# Patient Record
Sex: Male | Born: 1950 | Race: White | Hispanic: No | Marital: Single | Smoking: Former smoker
Health system: Southern US, Community
[De-identification: ages and names within clinical notes are randomized; demographics above are authoritative.]

## PROBLEM LIST (undated history)

## (undated) DIAGNOSIS — R030 Elevated blood-pressure reading, without diagnosis of hypertension: Secondary | ICD-10-CM

## (undated) DIAGNOSIS — J449 Chronic obstructive pulmonary disease, unspecified: Secondary | ICD-10-CM

## (undated) DIAGNOSIS — R351 Nocturia: Secondary | ICD-10-CM

## (undated) DIAGNOSIS — J439 Emphysema, unspecified: Secondary | ICD-10-CM

## (undated) DIAGNOSIS — R7989 Other specified abnormal findings of blood chemistry: Secondary | ICD-10-CM

## (undated) DIAGNOSIS — R319 Hematuria, unspecified: Secondary | ICD-10-CM

## (undated) DIAGNOSIS — N529 Male erectile dysfunction, unspecified: Secondary | ICD-10-CM

## (undated) DIAGNOSIS — E785 Hyperlipidemia, unspecified: Secondary | ICD-10-CM

## (undated) DIAGNOSIS — N401 Enlarged prostate with lower urinary tract symptoms: Secondary | ICD-10-CM

## (undated) HISTORY — DX: Benign prostatic hyperplasia with lower urinary tract symptoms: N40.1

## (undated) HISTORY — DX: Chronic obstructive pulmonary disease, unspecified: J44.9

## (undated) HISTORY — DX: Emphysema, unspecified: J43.9

## (undated) HISTORY — DX: Hyperlipidemia, unspecified: E78.5

## (undated) HISTORY — DX: Male erectile dysfunction, unspecified: N52.9

## (undated) HISTORY — DX: Other specified abnormal findings of blood chemistry: R79.89

## (undated) HISTORY — DX: Hematuria, unspecified: R31.9

## (undated) HISTORY — PX: OTHER SURGICAL HISTORY: SHX169

## (undated) HISTORY — DX: Nocturia: R35.1

## (undated) HISTORY — DX: Elevated blood-pressure reading, without diagnosis of hypertension: R03.0

---

## 2011-07-22 ENCOUNTER — Ambulatory Visit: Payer: Self-pay | Admitting: Family Medicine

## 2011-09-14 ENCOUNTER — Emergency Department: Payer: Self-pay

## 2012-07-07 ENCOUNTER — Emergency Department: Payer: Self-pay | Admitting: Emergency Medicine

## 2012-08-29 ENCOUNTER — Ambulatory Visit: Payer: Self-pay | Admitting: Internal Medicine

## 2012-09-14 ENCOUNTER — Ambulatory Visit: Payer: Self-pay | Admitting: Specialist

## 2014-06-06 IMAGING — CR DG RIBS 2V*R*
1 series · 4 of 4 positions shown · non-contrast
Comparison: none

REASON FOR EXAM: right rib pain
COMMENTS:

PROCEDURE:     MDR - MDR RIBS RIGHT UNLILATERAL  - August 29, 2012  [DATE]
RESULT:     The bones are osteopenic. There is prominent costochondral
calcification. No definite fracture is identified the marker over the area
the patient's pain is along the inferior margin of the right ribs.

[Series 1: ap · 0.17mm/px · 4 of 4 slices shown]
[im 1/4]
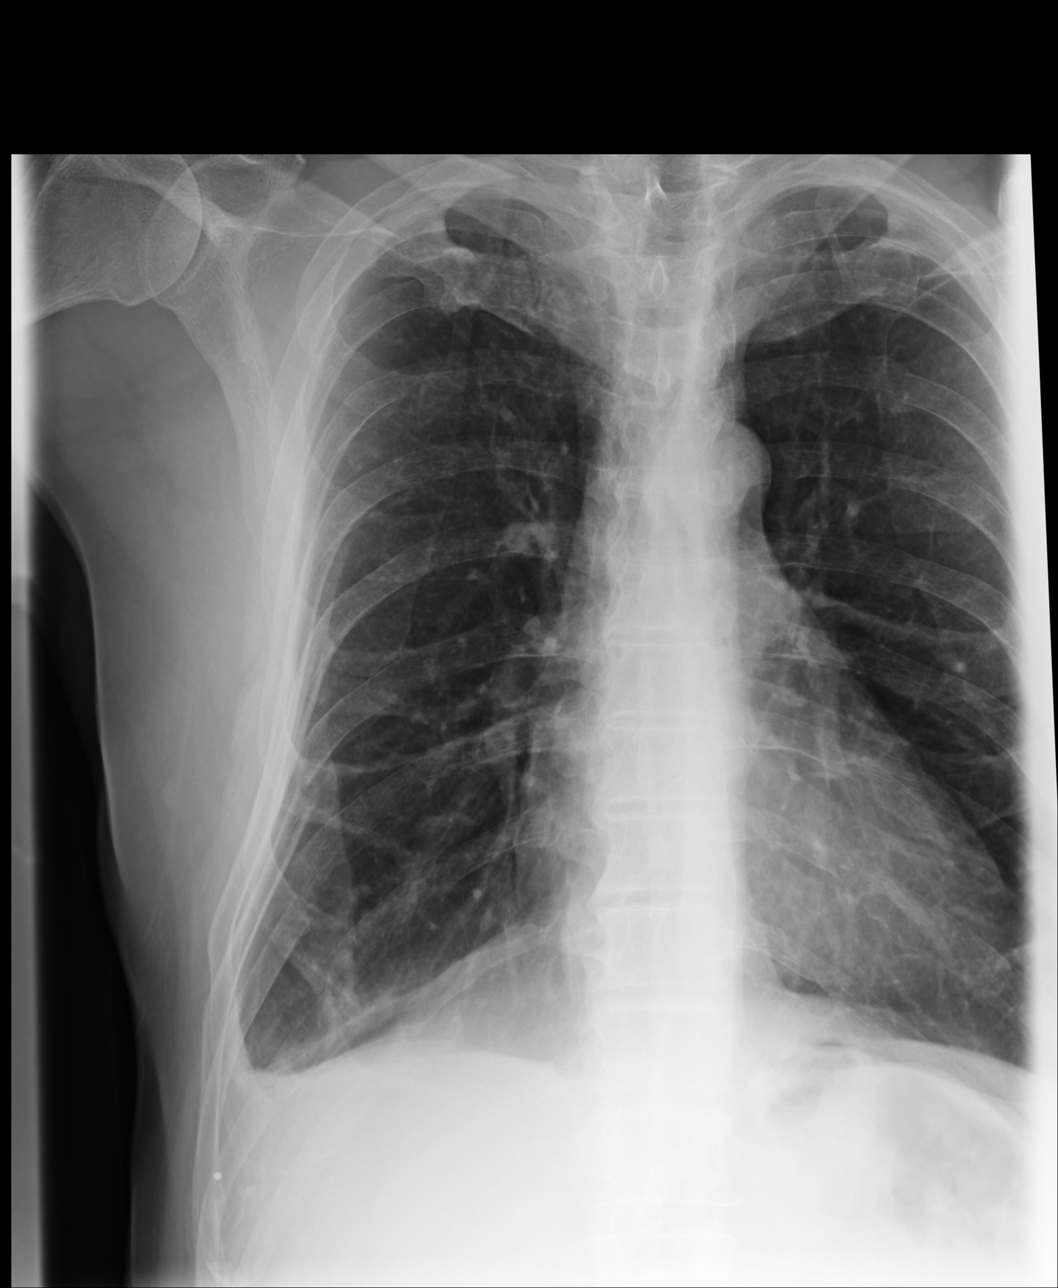
[im 2/4]
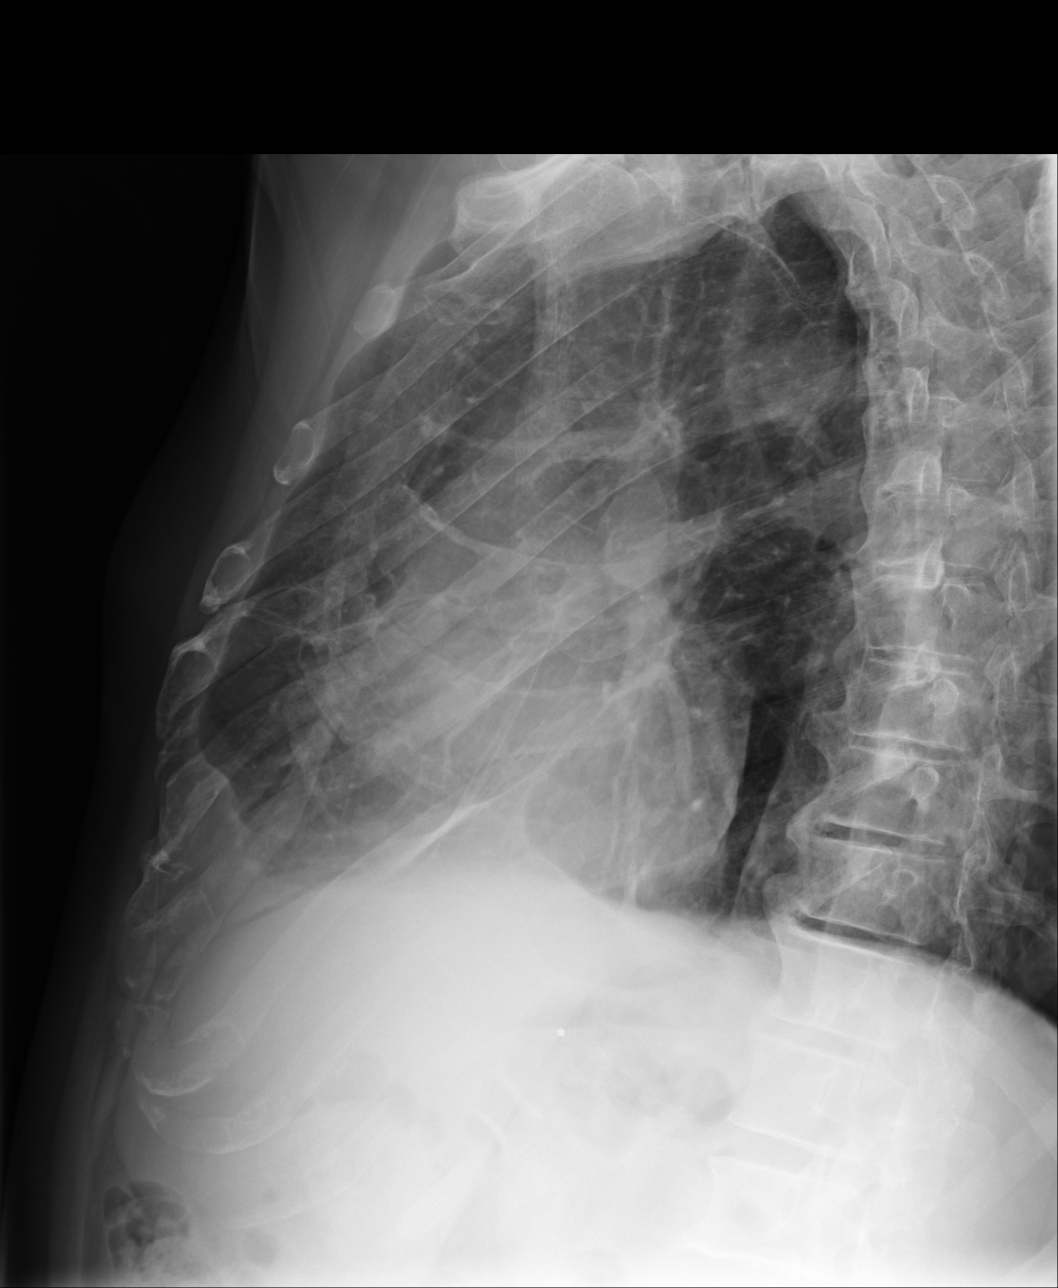
[im 3/4]
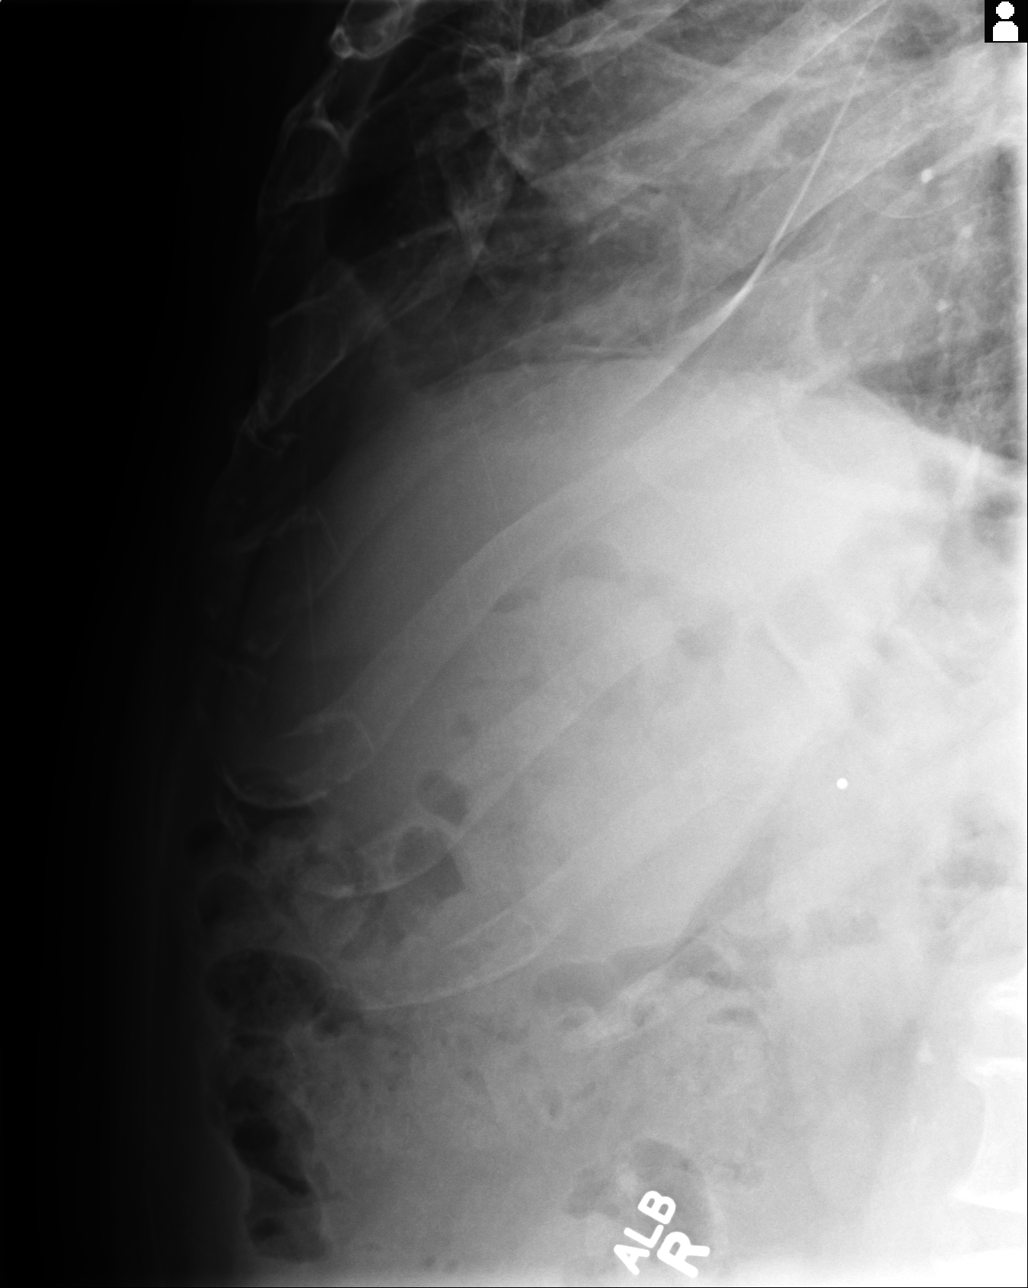
[im 4/4]
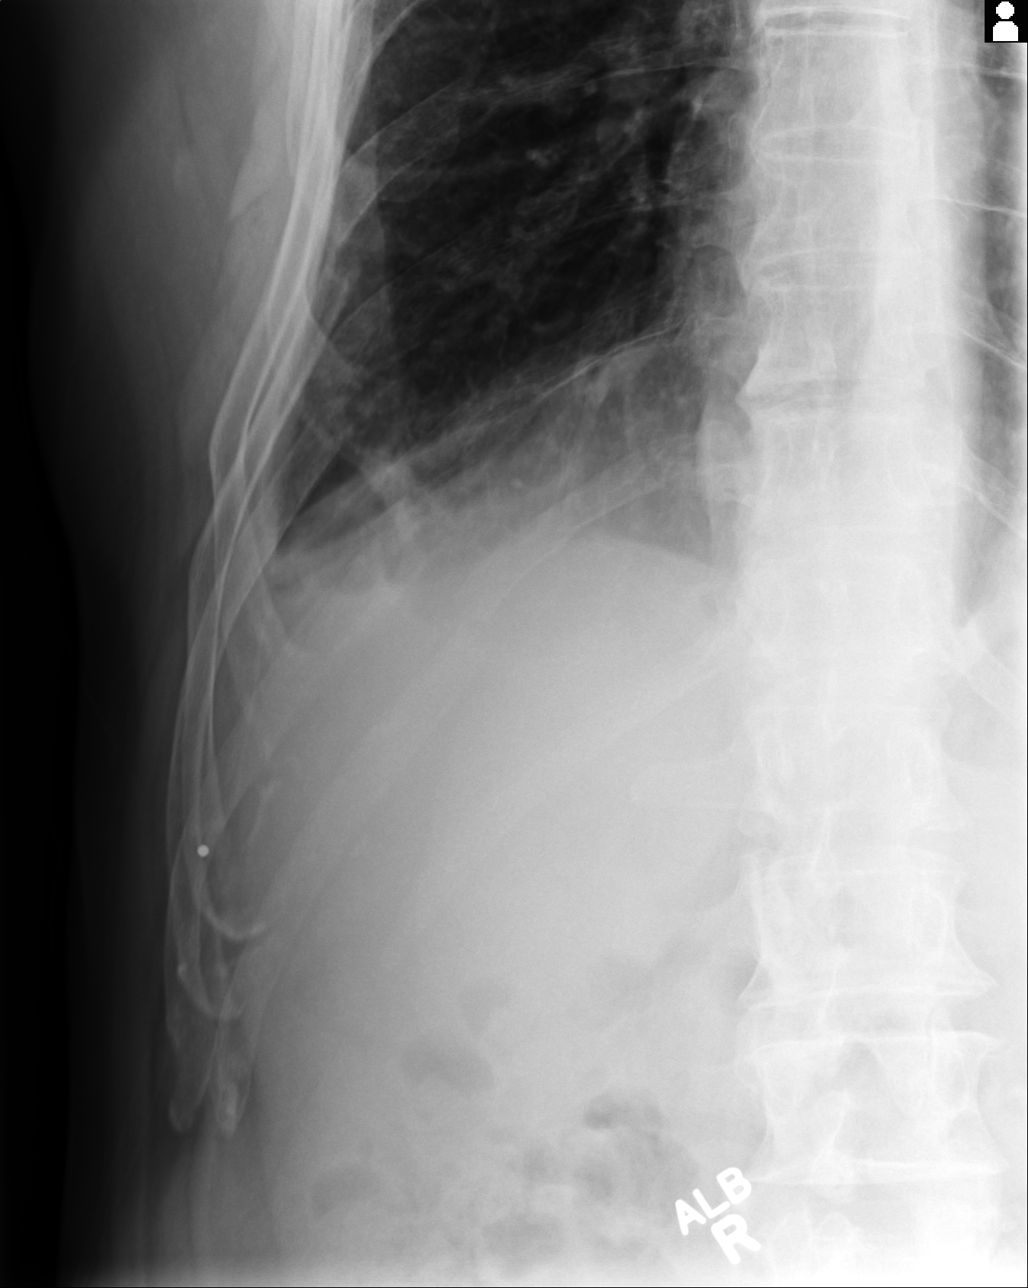

[4 of 4 positions shown; findings below may reference images not displayed]

IMPRESSION: No definite fracture. The bones are very osteopenic which
mixture underlying fracture.

[REDACTED]

## 2014-06-06 IMAGING — CR DG CHEST 2V
1 series · 3 of 3 positions shown · non-contrast
Comparison: none

REASON FOR EXAM: cough with recent rib injury from 07-07-12
COMMENTS:

[Series 1: pa · 0.17mm/px · 3 of 3 slices shown]
[im 1/3]
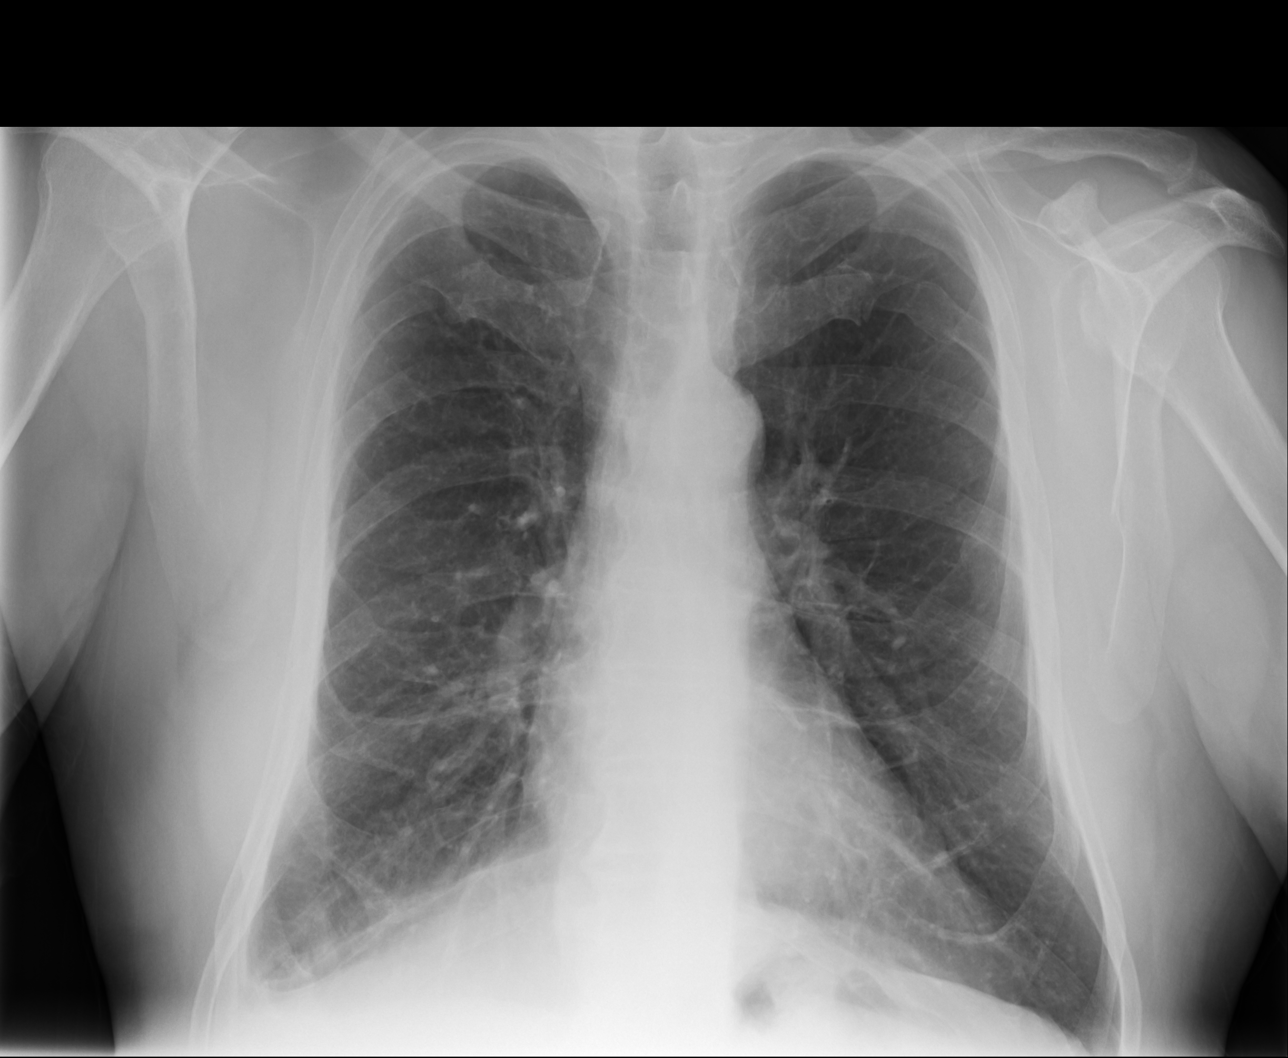
[im 2/3]
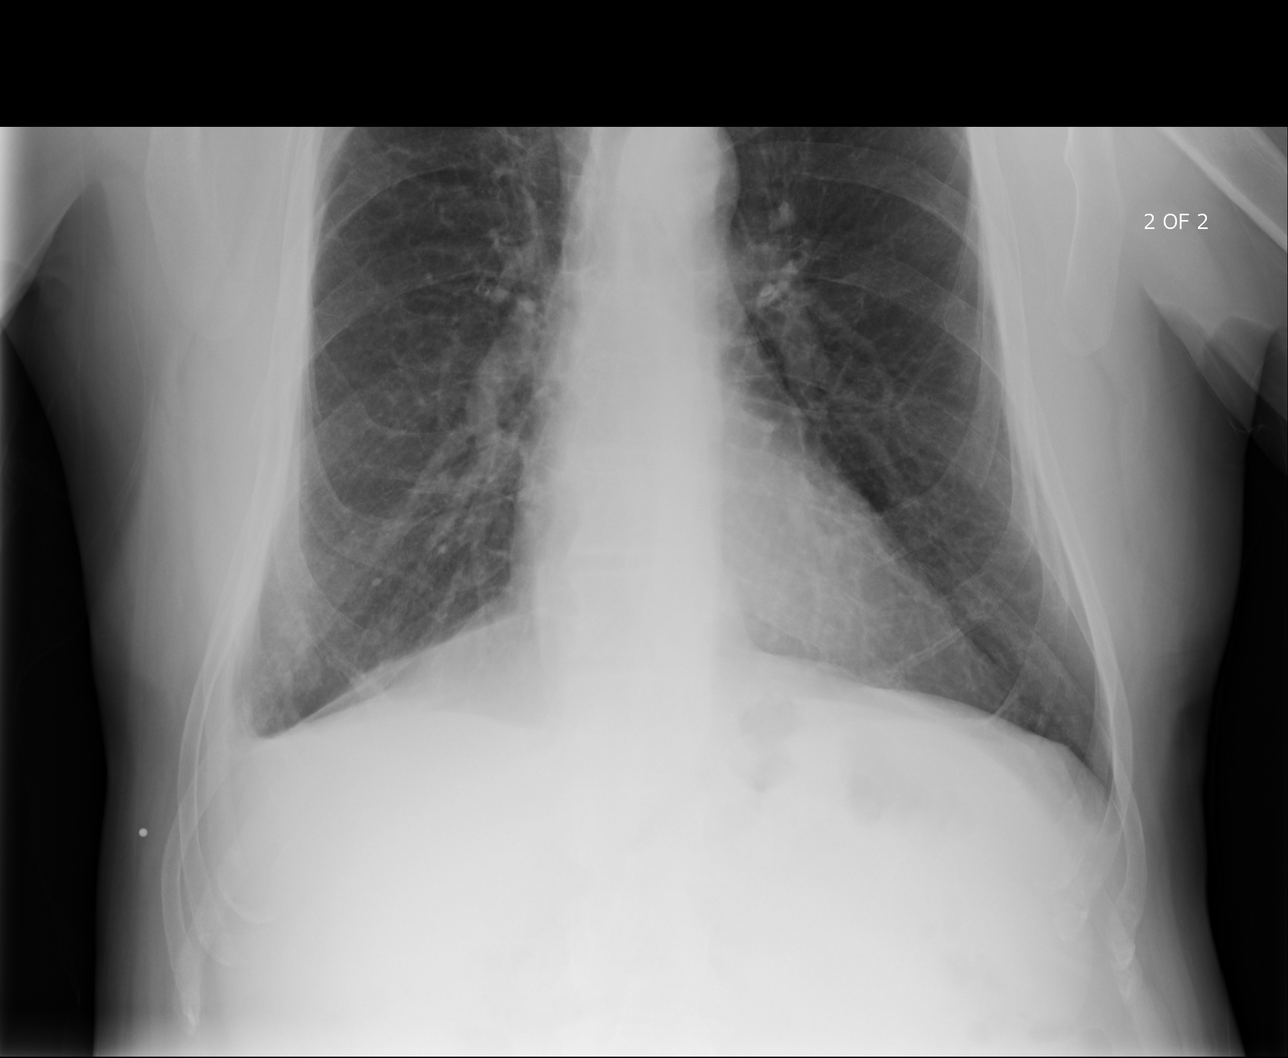
[im 3/3]
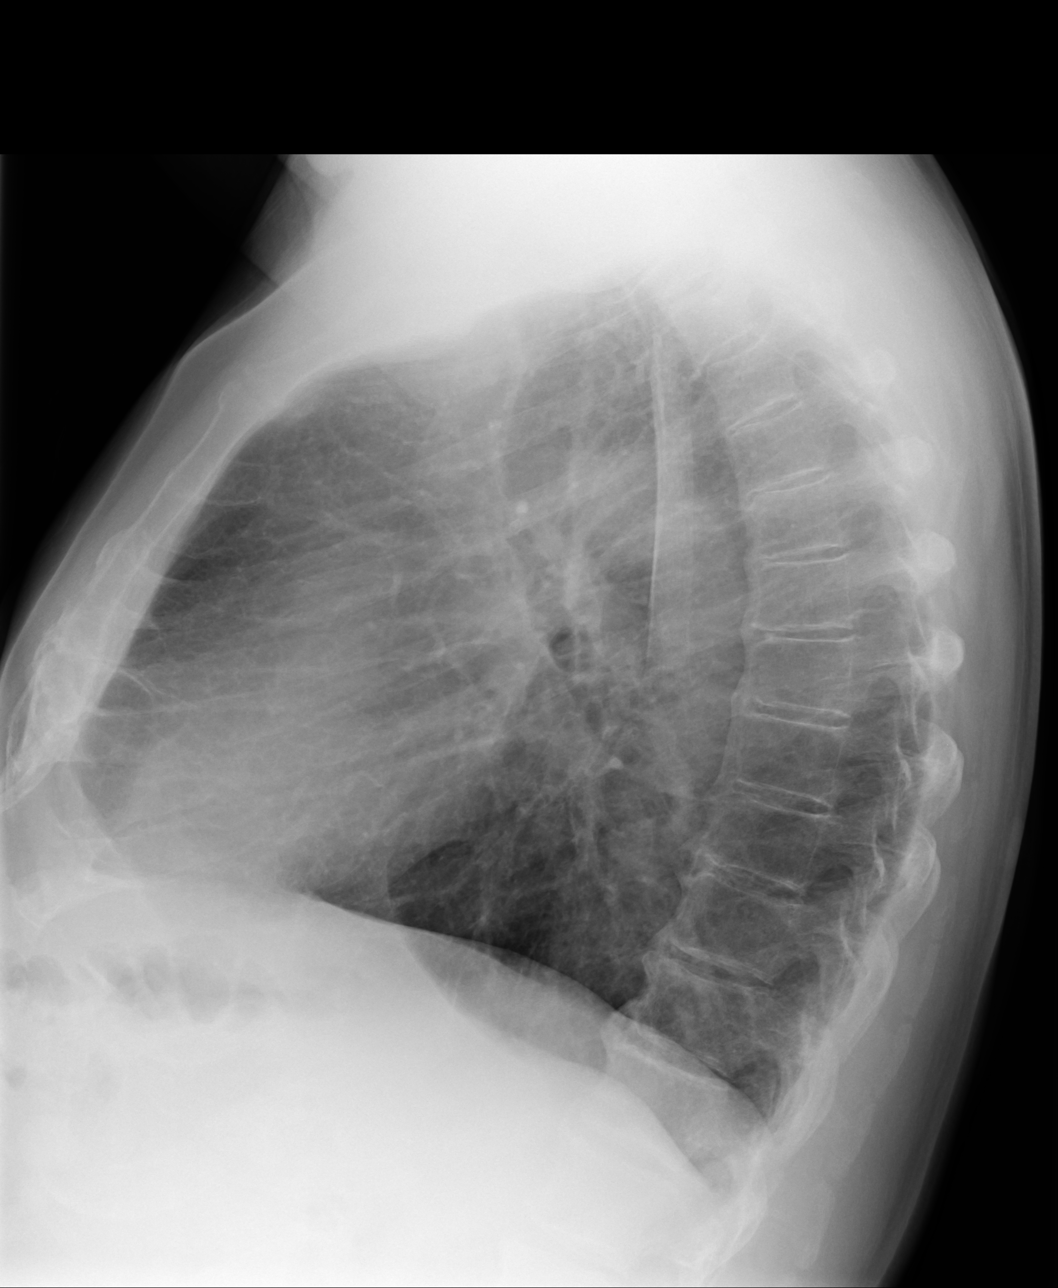

[3 of 3 positions shown; findings below may reference images not displayed]

PROCEDURE:     MDR - MDR CHEST PA(OR AP) AND LATERAL  - August 29, 2012  [DATE]

RESULT:     Comparison is made to the study 07 July, 2012.

The lungs are mildly hyperinflated but appear to be clear of infiltrate,
edema, effusion, mass and pneumothorax. Cardiac silhouette is normal in
size. Prominent costochondral calcification is present. There is minimal
blunting of the right costophrenic angle but not in the posterior gutter.
This could be secondary to pleural thickening or trace pleural fluid. The
possibility of an occult fracture in this region is not excluded. Correlate
clinically.
IMPRESSION: No acute cardiopulmonary disease evident. Note is made of
some minimal blunting in the right costophrenic angle region on the frontal
projections. No significant effusion is seen on the lateral view. The
possibility of pleural thickening or trace pleural effusion secondary to
underlying occult rib fractures not excluded. No pneumothorax evident.

[REDACTED]

## 2014-07-24 DIAGNOSIS — J439 Emphysema, unspecified: Secondary | ICD-10-CM | POA: Diagnosis not present

## 2014-07-24 DIAGNOSIS — R351 Nocturia: Secondary | ICD-10-CM | POA: Diagnosis not present

## 2014-07-24 DIAGNOSIS — R7989 Other specified abnormal findings of blood chemistry: Secondary | ICD-10-CM | POA: Diagnosis not present

## 2014-07-24 DIAGNOSIS — Z23 Encounter for immunization: Secondary | ICD-10-CM | POA: Diagnosis not present

## 2014-07-24 DIAGNOSIS — Z0000 Encounter for general adult medical examination without abnormal findings: Secondary | ICD-10-CM | POA: Diagnosis not present

## 2014-07-24 DIAGNOSIS — E782 Mixed hyperlipidemia: Secondary | ICD-10-CM | POA: Diagnosis not present

## 2014-07-24 DIAGNOSIS — N401 Enlarged prostate with lower urinary tract symptoms: Secondary | ICD-10-CM | POA: Diagnosis not present

## 2014-09-15 DIAGNOSIS — J439 Emphysema, unspecified: Secondary | ICD-10-CM | POA: Diagnosis not present

## 2015-01-26 ENCOUNTER — Ambulatory Visit: Payer: Self-pay | Admitting: Internal Medicine

## 2015-01-27 ENCOUNTER — Encounter: Payer: Self-pay | Admitting: Internal Medicine

## 2015-01-27 ENCOUNTER — Ambulatory Visit (INDEPENDENT_AMBULATORY_CARE_PROVIDER_SITE_OTHER): Payer: Medicare Other | Admitting: Internal Medicine

## 2015-01-27 VITALS — BP 142/82 | HR 76 | Ht 71.0 in | Wt 184.4 lb

## 2015-01-27 DIAGNOSIS — R351 Nocturia: Secondary | ICD-10-CM

## 2015-01-27 DIAGNOSIS — R7989 Other specified abnormal findings of blood chemistry: Secondary | ICD-10-CM | POA: Insufficient documentation

## 2015-01-27 DIAGNOSIS — J449 Chronic obstructive pulmonary disease, unspecified: Secondary | ICD-10-CM

## 2015-01-27 DIAGNOSIS — N529 Male erectile dysfunction, unspecified: Secondary | ICD-10-CM | POA: Insufficient documentation

## 2015-01-27 DIAGNOSIS — N401 Enlarged prostate with lower urinary tract symptoms: Secondary | ICD-10-CM

## 2015-01-27 DIAGNOSIS — E785 Hyperlipidemia, unspecified: Secondary | ICD-10-CM

## 2015-01-27 DIAGNOSIS — J40 Bronchitis, not specified as acute or chronic: Secondary | ICD-10-CM

## 2015-01-27 HISTORY — DX: Nocturia: R35.1

## 2015-01-27 HISTORY — DX: Male erectile dysfunction, unspecified: N52.9

## 2015-01-27 HISTORY — DX: Benign prostatic hyperplasia with lower urinary tract symptoms: N40.1

## 2015-01-27 HISTORY — DX: Hyperlipidemia, unspecified: E78.5

## 2015-01-27 HISTORY — DX: Chronic obstructive pulmonary disease, unspecified: J44.9

## 2015-01-27 HISTORY — DX: Other specified abnormal findings of blood chemistry: R79.89

## 2015-01-27 MED ORDER — METHYLPREDNISOLONE 4 MG PO TBPK: ORAL_TABLET | ORAL | Status: DC

## 2015-01-27 MED ORDER — AMOXICILLIN-POT CLAVULANATE 875-125 MG PO TABS: 1.0000 | ORAL_TABLET | Freq: Two times a day (BID) | ORAL | Status: DC

## 2015-01-27 MED ORDER — GUAIFENESIN-CODEINE 100-10 MG/5ML PO SYRP: 5.0000 mL | ORAL_SOLUTION | Freq: Three times a day (TID) | ORAL | Status: DC | PRN

## 2015-01-27 NOTE — Patient Instructions (Signed)

## 2015-01-27 NOTE — Progress Notes (Signed)
Date:  01/27/2015   Name:  Benjamin Mclaughlin   DOB:  May 25, 1950   MRN:  [id number]   Chief Complaint: Cough and Sore Throat  patient presents with 1 week of cough sore throat and hoarseness. He's been bringing up some pale brown thick phlegm. He denies fever or chills. He denies wheezing. He says at baseline he can walk a mile on the treadmill without having to rest. He has a diagnosis of COPD has not smoked in 16 years. He's tried a number of inhalers in the past but states that they never seem to help. He is currently on no medications.  Review of Systems:  Review of Systems  Constitutional: Positive for fatigue. Negative for fever, chills and diaphoresis.  HENT: Positive for sore throat and voice change. Negative for ear discharge, hearing loss, sinus pressure, sneezing and trouble swallowing.   Respiratory: Positive for cough. Negative for chest tightness, shortness of breath and wheezing.   Cardiovascular: Negative for chest pain and palpitations.  Gastrointestinal: Negative for nausea, abdominal pain and diarrhea.  Hematological: Negative for adenopathy.    Patient Active Problem List   Diagnosis Date Noted  . COPD, moderate 01/27/2015  . Abnormal TSH 01/27/2015  . Benign prostatic hypertrophy (BPH) with nocturia 01/27/2015  . Hyperlipidemia 01/27/2015  . Erectile dysfunction 01/27/2015    Prior to Admission medications   Not on File    No Known Allergies  No past surgical history on file.  Social History  Substance Use Topics  . Smoking status: Former Research scientist (life sciences)  . Smokeless tobacco: None  . Alcohol Use: 0.0 oz/week    0 Standard drinks or equivalent per week     Medication list has been reviewed and updated.  Physical Examination:  Physical Exam  Constitutional: He appears well-developed and well-nourished.  HENT:  Nose: Right sinus exhibits no maxillary sinus tenderness. Left sinus exhibits no maxillary sinus tenderness.  Mouth/Throat: Uvula is midline and  oropharynx is clear and moist.  Hoarse voice  Cardiovascular: Normal rate, regular rhythm and normal heart sounds.   Pulmonary/Chest: Effort normal. He has decreased breath sounds in the right upper field and the left upper field. He has no wheezes. He has no rhonchi.  Lymphadenopathy:    He has no cervical adenopathy.  Neurological: He is alert.    BP 142/82 mmHg  Pulse 76  Ht '5\' 11"'$  (1.803 m)  Wt 184 lb 6.4 oz (83.643 kg)  BMI 25.73 kg/m2  SpO2 97%  Assessment and Plan: 1. Bronchitis - amoxicillin-clavulanate (AUGMENTIN) 875-125 MG per tablet; Take 1 tablet by mouth 2 (two) times daily.  Dispense: 20 tablet; Refill: 0 - guaiFENesin-codeine (ROBITUSSIN AC) 100-10 MG/5ML syrup; Take 5 mLs by mouth 3 (three) times daily as needed for cough.  Dispense: 150 mL; Refill: 0  2. COPD, moderate - methylPREDNISolone (MEDROL DOSEPAK) 4 MG TBPK tablet; Take as directed -  Dispense: 21 tablet; Refill: 0   Halina Maidens, MD [facility]  01/27/2015

## 2015-01-30 ENCOUNTER — Telehealth: Payer: Self-pay

## 2015-01-30 DIAGNOSIS — J40 Bronchitis, not specified as acute or chronic: Secondary | ICD-10-CM

## 2015-01-30 MED ORDER — CEFDINIR 300 MG PO CAPS: 300.0000 mg | ORAL_CAPSULE | Freq: Two times a day (BID) | ORAL | Status: DC

## 2015-01-30 NOTE — Telephone Encounter (Signed)
Patient have watery stools five times or more a day with Augmentin, can you send something different.dr

## 2015-01-30 NOTE — Telephone Encounter (Signed)
Augmentin caused diarrhea.  Patient request another antibiotics.

## 2015-02-26 ENCOUNTER — Other Ambulatory Visit: Payer: Self-pay

## 2015-02-26 ENCOUNTER — Other Ambulatory Visit: Payer: Self-pay | Admitting: Internal Medicine

## 2015-02-26 DIAGNOSIS — J40 Bronchitis, not specified as acute or chronic: Secondary | ICD-10-CM

## 2015-12-31 ENCOUNTER — Ambulatory Visit (INDEPENDENT_AMBULATORY_CARE_PROVIDER_SITE_OTHER): Payer: Medicare Other | Admitting: Urology

## 2015-12-31 ENCOUNTER — Encounter: Payer: Self-pay | Admitting: Urology

## 2015-12-31 VITALS — BP 172/89 | HR 82 | Ht 71.0 in | Wt 179.0 lb

## 2015-12-31 DIAGNOSIS — N400 Enlarged prostate without lower urinary tract symptoms: Secondary | ICD-10-CM

## 2015-12-31 DIAGNOSIS — Z125 Encounter for screening for malignant neoplasm of prostate: Secondary | ICD-10-CM | POA: Insufficient documentation

## 2015-12-31 DIAGNOSIS — Z23 Encounter for immunization: Secondary | ICD-10-CM | POA: Insufficient documentation

## 2015-12-31 DIAGNOSIS — R972 Elevated prostate specific antigen [PSA]: Secondary | ICD-10-CM

## 2015-12-31 DIAGNOSIS — IMO0001 Reserved for inherently not codable concepts without codable children: Secondary | ICD-10-CM

## 2015-12-31 DIAGNOSIS — R030 Elevated blood-pressure reading, without diagnosis of hypertension: Secondary | ICD-10-CM

## 2015-12-31 DIAGNOSIS — R319 Hematuria, unspecified: Secondary | ICD-10-CM

## 2015-12-31 DIAGNOSIS — J439 Emphysema, unspecified: Secondary | ICD-10-CM

## 2015-12-31 HISTORY — DX: Hematuria, unspecified: R31.9

## 2015-12-31 HISTORY — DX: Emphysema, unspecified: J43.9

## 2015-12-31 HISTORY — DX: Reserved for inherently not codable concepts without codable children: IMO0001

## 2015-12-31 MED ORDER — TAMSULOSIN HCL 0.4 MG PO CAPS: 0.4000 mg | ORAL_CAPSULE | Freq: Every day | ORAL | 11 refills | Status: AC

## 2015-12-31 NOTE — Progress Notes (Signed)
12/31/2015 2:54 PM   Benjamin Mclaughlin [birth year] [id number]  Referring provider: [name], MD [address] [name], [place] [address]  Chief Complaint  Patient presents with  . Elevated PSA    New Patient    HPI: The patient is a 65 year old gentleman who presents for evaluation of an elevated PSA. His PSA is currently 5.1. It was 1.1 in 2016 and 1.8 2014.  The patient's biggest complaint this time is nocturia 3-4. He also has daytime frequency every 2 hours. He notes a weak stream. This has been getting worse over the last few years. He's never tried medication for his urinary issues before. He does also note some urgency. He denies intermittency.   PMH: Past Medical History:  Diagnosis Date  . Abnormal TSH 01/27/2015   07/2014  TSH=5.900   . Benign prostatic hypertrophy (BPH) with nocturia 01/27/2015   07/2014 PSA = 1.1   . Blood in the urine 12/31/2015  . Blood pressure elevated 12/31/2015  . Chronic obstructive pulmonary emphysema ([place]) 12/31/2015  . COPD, moderate ([place]) 01/27/2015   Patient denies response to maintenance therapy with inhalers   . Erectile dysfunction 01/27/2015  . Hyperlipidemia 01/27/2015   TC 193 TG 82 HDL 52 LDL 125     Surgical History: Past Surgical History:  Procedure Laterality Date  . none      Home Medications:    Medication List       Accurate as of 12/31/15  2:54 PM. Always use your most recent med list.          FISH OIL BURP-LESS 1200 MG Caps Take by mouth.   Milk Thistle 1000 MG Caps Take by mouth.   MULTIVITAMIN ADULT PO Take by mouth.   RESVERATROL ULTRA Caps Take by mouth.   Turmeric Curcumin 500 MG Caps Take by mouth.   VITAMIN C PLUS 500 MG Tabs Take by mouth.       Allergies: No Known Allergies  Family History: Family History  Problem Relation Age of Onset  . Alzheimer's disease Mother   . Bladder Cancer Neg Hx   . Prostate cancer Neg Hx   . Kidney cancer Neg Hx     Social History:   reports that he has quit smoking. He has never used smokeless tobacco. He reports that he drinks alcohol. He reports that he does not use drugs.  ROS: UROLOGY Frequent Urination?: Yes Hard to postpone urination?: Yes Burning/pain with urination?: Yes Get up at night to urinate?: Yes Leakage of urine?: No Urine stream starts and stops?: No Trouble starting stream?: No Do you have to strain to urinate?: No Blood in urine?: No Urinary tract infection?: No Sexually transmitted disease?: No Injury to kidneys or bladder?: No Painful intercourse?: No Weak stream?: Yes Erection problems?: No Penile pain?: No  Gastrointestinal Nausea?: No Vomiting?: No Indigestion/heartburn?: No Diarrhea?: No Constipation?: No  Constitutional Fever: No Night sweats?: No Weight loss?: No Fatigue?: No  Skin Skin rash/lesions?: No Itching?: No  Eyes Blurred vision?: No Double vision?: No  Ears/Nose/Throat Sore throat?: No Sinus problems?: No  Hematologic/Lymphatic Swollen glands?: No Easy bruising?: No  Cardiovascular Leg swelling?: No Chest pain?: No  Respiratory Cough?: No Shortness of breath?: No  Endocrine Excessive thirst?: No  Musculoskeletal Back pain?: No Joint pain?: No  Neurological Headaches?: No Dizziness?: No  Psychologic Depression?: No Anxiety?: No  Physical Exam: BP (!) 172/89   Pulse 82   Ht '5\' 11"'$  (1.803 m)  Wt 179 lb (81.2 kg)   BMI 24.97 kg/m   Constitutional:  Alert and oriented, No acute distress. HEENT: [place] AT, moist mucus membranes.  Trachea midline, no masses. Cardiovascular: No clubbing, cyanosis, or edema. Respiratory: Normal respiratory effort, no increased work of breathing. GI: Abdomen is soft, nontender, nondistended, no abdominal masses GU: No CVA tenderness. DRE: 2+, smooth, no nodules, benign. Skin: No rashes, bruises or suspicious lesions. Lymph: No cervical or inguinal adenopathy. Neurologic: Grossly intact, no focal  deficits, moving all 4 extremities. Psychiatric: Normal mood and affect.  Laboratory Data: No results found for: WBC, HGB, HCT, MCV, PLT  No results found for: CREATININE  No results found for: PSA  No results found for: TESTOSTERONE  No results found for: HGBA1C  Urinalysis No results found for: COLORURINE, APPEARANCEUR, LABSPEC, PHURINE, GLUCOSEU, HGBUR, BILIRUBINUR, KETONESUR, PROTEINUR, UROBILINOGEN, NITRITE, LEUKOCYTESUR    Assessment & Plan:    1. Elevated PSA I discussed the patient in great detail the utility of PSA testing and screening for prostate cancer. He understands the next step in the workup we repeat his PSA to confirm true elevation. Pending I will tentatively schedule him for a prostate biopsy. He understands the risks, benefits, indications of this procedure. Understands the risks include but are not limited to bleeding and infection. He understands see blood in his stool, urine, and his semen. The particulate he notes that his semen may contain blood for up to 6 weeks. He also understands there is risk of infection of a partial 1-2% which required IV antibiotics. All questions were answered. The patient elected to proceed. We will tentatively schedule his prostate biopsy pending results of his repeat PSA today.  2. The patient will start Flomax 0.4 mg. He was warned of the risks which include orthostatic hypotension.  No Follow-up on file.  Nickie Retort, MD  [facility] [address], [place], [place] [address] [phone]

## 2016-01-01 LAB — PSA: Prostate Specific Ag, Serum: 4.2 ng/mL — ABNORMAL HIGH (ref 0.0–4.0)

## 2016-01-05 ENCOUNTER — Telehealth: Payer: Self-pay

## 2016-01-05 NOTE — Telephone Encounter (Signed)
Pt called requesting PSA results. Please advise.

## 2016-01-07 NOTE — Telephone Encounter (Signed)
LMOM

## 2016-01-07 NOTE — Telephone Encounter (Signed)
PSA remains elevated at 4.2.  Recommend proceeding with prostate biopsy as we discussed at his visit.

## 2016-01-07 NOTE — Telephone Encounter (Signed)
Spoke with pt in reference to PSA results and prostate bx. Pt had a few questions regarding prostate bx. All questions were answered in depth. Pt voiced understanding of whole conversation.

## 2016-01-26 ENCOUNTER — Ambulatory Visit (INDEPENDENT_AMBULATORY_CARE_PROVIDER_SITE_OTHER): Payer: Medicare Other | Admitting: Urology

## 2016-01-26 ENCOUNTER — Other Ambulatory Visit: Payer: Self-pay | Admitting: Urology

## 2016-01-26 ENCOUNTER — Encounter: Payer: Self-pay | Admitting: Urology

## 2016-01-26 VITALS — BP 153/84 | HR 77 | Ht 71.0 in | Wt 181.2 lb

## 2016-01-26 DIAGNOSIS — R972 Elevated prostate specific antigen [PSA]: Secondary | ICD-10-CM

## 2016-01-26 MED ORDER — LIDOCAINE HCL 2 % EX GEL
1.0000 "application " | Freq: Once | CUTANEOUS | Status: AC
  Administered 2016-01-26: 1 via URETHRAL

## 2016-01-26 MED ORDER — GENTAMICIN SULFATE 40 MG/ML IJ SOLN
80.0000 mg | Freq: Once | INTRAMUSCULAR | Status: AC
  Administered 2016-01-26: 80 mg via INTRAMUSCULAR

## 2016-01-26 MED ORDER — LEVOFLOXACIN 500 MG PO TABS
500.0000 mg | ORAL_TABLET | Freq: Once | ORAL | Status: AC
  Administered 2016-01-26: 500 mg via ORAL

## 2016-01-26 NOTE — Progress Notes (Signed)
Prostate Biopsy Procedure   Informed consent was obtained after discussing risks/benefits of the procedure.  A time out was performed to ensure correct patient identity.  Pre-Procedure: - Last PSA Level: No results found for: PSA - Gentamicin given prophylactically - Levaquin 500 mg administered PO -Transrectal Ultrasound performed revealing a 22 gm prostate -No significant hypoechoic or median lobe noted - suspicious area left lateral lobe  Procedure: - Prostate block performed using 10 cc 1% lidocaine and biopsies taken from sextant areas, a total of 12 under ultrasound guidance.  Post-Procedure: - Patient tolerated the procedure well - He was counseled to seek immediate medical attention if experiences any severe pain, significant bleeding, or fevers - Return in one week to discuss biopsy results

## 2016-01-29 LAB — PATHOLOGY REPORT

## 2016-02-02 ENCOUNTER — Telehealth: Payer: Self-pay

## 2016-02-02 ENCOUNTER — Other Ambulatory Visit: Payer: Self-pay | Admitting: Urology

## 2016-02-02 ENCOUNTER — Ambulatory Visit: Payer: Medicare Other | Admitting: Urology

## 2016-02-02 DIAGNOSIS — R972 Elevated prostate specific antigen [PSA]: Secondary | ICD-10-CM | POA: Insufficient documentation

## 2016-02-02 DIAGNOSIS — N400 Enlarged prostate without lower urinary tract symptoms: Secondary | ICD-10-CM

## 2016-02-02 NOTE — Telephone Encounter (Signed)
-----   Message from Ardis Hughs, MD sent at 02/02/2016  5:30 AM EDT ----- Please contact patient and let him know that his biospy was negative.  He does not need to be seen until 6 months with a PSA prior. Thanks, [employer]

## 2016-02-02 NOTE — Telephone Encounter (Signed)
Spoke with pt in reference to prostate bx results. Pt voiced understanding. Pt was transferred to the front to make 30mof/u with labs. Orders placed.

## 2016-02-17 ENCOUNTER — Encounter: Payer: Medicare Other | Admitting: Internal Medicine

## 2016-07-11 ENCOUNTER — Telehealth: Payer: Self-pay | Admitting: Urology

## 2016-07-11 NOTE — Telephone Encounter (Signed)
Pt called to cancel appt in March with you.  He had PSA checked and it was 1.33.

## 2016-07-29 ENCOUNTER — Other Ambulatory Visit: Payer: Medicare Other

## 2016-08-02 ENCOUNTER — Ambulatory Visit: Payer: Medicare Other

## 2018-02-21 ENCOUNTER — Ambulatory Visit: Payer: Medicare Other | Admitting: Internal Medicine

## 2019-06-17 ENCOUNTER — Ambulatory Visit: Payer: Medicare Other | Attending: Internal Medicine

## 2019-06-17 DIAGNOSIS — Z23 Encounter for immunization: Secondary | ICD-10-CM

## 2019-06-17 NOTE — Progress Notes (Signed)
   Covid-19 Vaccination Clinic  Name:  Benjamin Mclaughlin    MRN: [id number] DOB: [birth year]  06/17/2019  Benjamin Mclaughlin was observed post Covid-19 immunization for 15 minutes without incidence. He was provided with Vaccine Information Sheet and instruction to access the V-Safe system.   Benjamin Mclaughlin was instructed to call 911 with any severe reactions post vaccine: Marland Kitchen Difficulty breathing  . Swelling of your face and throat  . A fast heartbeat  . A bad rash all over your body  . Dizziness and weakness    Immunizations Administered    Name Date Dose VIS Date Route   Pfizer COVID-19 Vaccine 06/17/2019  9:32 AM 0.3 mL 04/19/2019 Intramuscular   Manufacturer: [employer]   Lot: [id number]   [place]: [id number]

## 2019-07-10 ENCOUNTER — Ambulatory Visit: Payer: Medicare Other | Attending: Internal Medicine

## 2019-07-10 DIAGNOSIS — Z23 Encounter for immunization: Secondary | ICD-10-CM | POA: Insufficient documentation

## 2021-12-07 ENCOUNTER — Other Ambulatory Visit: Payer: Self-pay | Admitting: *Deleted

## 2021-12-07 DIAGNOSIS — Z122 Encounter for screening for malignant neoplasm of respiratory organs: Secondary | ICD-10-CM

## 2021-12-07 DIAGNOSIS — Z87891 Personal history of nicotine dependence: Secondary | ICD-10-CM

## 2021-12-28 ENCOUNTER — Ambulatory Visit (INDEPENDENT_AMBULATORY_CARE_PROVIDER_SITE_OTHER): Payer: Medicare Other | Admitting: Acute Care

## 2021-12-28 ENCOUNTER — Encounter: Payer: Self-pay | Admitting: Acute Care

## 2021-12-28 DIAGNOSIS — Z87891 Personal history of nicotine dependence: Secondary | ICD-10-CM | POA: Diagnosis not present

## 2021-12-28 NOTE — Progress Notes (Signed)
Virtual Visit via Telephone Note  I connected with Benjamin Mclaughlin on 03/23/21 at  2:00 PM EST by telephone and verified that I am speaking with the correct person using two identifiers.  Location: Patient: Home Provider: Working from home   I discussed the limitations, risks, security and privacy concerns of performing an evaluation and management service by telephone and the availability of in person appointments. I also discussed with the patient that there may be a patient responsible charge related to this service. The patient expressed understanding and agreed to proceed.  Shared Decision Making Visit Lung Cancer Screening Program [phone])   Eligibility: Age 71 y.o. Pack Years Smoking History Calculation 50 (# packs/per year x # years smoked) Recent History of coughing up blood  no Unexplained weight loss? no ( >Than 15 pounds within the last 6 months ) Prior History Lung / other cancer no (Diagnosis within the last 5 years already requiring surveillance chest CT Scans). Smoking Status Former Smoker Former Smokers: Years since quit: 8 years  Quit Date: 2015  Visit Components: Discussion included one or more decision making aids. yes Discussion included risk/benefits of screening. yes Discussion included potential follow up diagnostic testing for abnormal scans. yes Discussion included meaning and risk of over diagnosis. yes Discussion included meaning and risk of False Positives. yes Discussion included meaning of total radiation exposure. yes  Counseling Included: Importance of adherence to annual lung cancer LDCT screening. yes Impact of comorbidities on ability to participate in the program. yes Ability and willingness to under diagnostic treatment. yes  Smoking Cessation Counseling: Current Smokers:  Discussed importance of smoking cessation. yes Information about tobacco cessation classes and interventions provided to patient. yes Patient provided with "ticket"  for LDCT Scan. yes Symptomatic Patient. no  Counseling NA Diagnosis Code: Tobacco Use Z72.0 Asymptomatic Patient yes  Counseling NA Former Smokers:  Discussed the importance of maintaining cigarette abstinence. yes Diagnosis Code: Personal History of Nicotine Dependence. V89.381 Information about tobacco cessation classes and interventions provided to patient. Yes Patient provided with "ticket" for LDCT Scan. yes Written Order for Lung Cancer Screening with LDCT placed in Epic. Yes (CT Chest Lung Cancer Screening Low Dose W/O CM) [id number] Z12.2-Screening of respiratory organs Z87.891-Personal history of nicotine dependence   I spent 25 minutes of face to face time with him discussing the risks and benefits of lung cancer screening. We viewed a power point together that explained in detail the above noted topics. We took the time to pause the power point at intervals to allow for questions to be asked and answered to ensure understanding. We discussed that he had taken the single most powerful action possible to decrease his risk of developing lung cancer when he quit smoking. I counseled him to remain smoke free, and to contact me if he ever had the desire to smoke again so that I can provide resources and tools to help support the effort to remain smoke free. We discussed the time and location of the scan, and that either  [name] or I will call with the results within  24-48 hours of receiving them. He has my card and contact information in the event he needs to speak with me, in addition to a copy of the power point we reviewed as a resource. He verbalized understanding of all of the above and had no further questions upon leaving the office.     I explained to the patient that there has been a high incidence of  coronary artery disease noted on these exams. I explained that this is a non-gated exam therefore degree or severity cannot be determined. This patient is not on statin  therapy. I have asked the patient to follow-up with their PCP regarding any incidental finding of coronary artery disease and management with diet or medication as they feel is clinically indicated. The patient verbalized understanding of the above and had no further questions.   Emer Onnen D. Kenton Kingfisher, NP-C [facility] Personal contact information can be found on Amion  12/28/2021, 9:41 AM

## 2021-12-28 NOTE — Patient Instructions (Signed)
Thank you for participating in the [place] Lung Cancer Screening Program. It was our pleasure to meet you today. We will call you with the results of your scan within the next few days. Your scan will be assigned a Lung RADS category score by the physicians reading the scans.  This Lung RADS score determines follow up scanning.  See below for description of categories, and follow up screening recommendations. We will be in touch to schedule your follow up screening annually or based on recommendations of our providers. We will fax a copy of your scan results to your Primary Care Physician, or the physician who referred you to the program, to ensure they have the results. Please call the office if you have any questions or concerns regarding your scanning experience or results.  Our office number is [phone]. Please speak with Denise Phelps, RN. , or  Denise Buckner RN, They are  our Lung Cancer Screening RN.'s If They are unavailable when you call, Please leave a message on the voice mail. We will return your call at our earliest convenience.This voice mail is monitored several times a day.  Remember, if your scan is normal, we will scan you annually as long as you continue to meet the criteria for the program. (Age 55-77, Current smoker or smoker who has quit within the last 15 years). If you are a smoker, remember, quitting is the single most powerful action that you can take to decrease your risk of lung cancer and other pulmonary, breathing related problems. We know quitting is hard, and we are here to help.  Please let us know if there is anything we can do to help you meet your goal of quitting. If you are a former smoker, congratulations. We are proud of you! Remain smoke free! Remember you can refer friends or family members through the number above.  We will screen them to make sure they meet criteria for the program. Thank you for helping us take better care of you by  participating in Lung Screening.  You can receive free nicotine replacement therapy ( patches, gum or mints) by calling 1-800-QUIT NOW. Please call so we can get you on the path to becoming  a non-smoker. I know it is hard, but you can do this!  Lung RADS Categories:  Lung RADS 1: no nodules or definitely non-concerning nodules.  Recommendation is for a repeat annual scan in 12 months.  Lung RADS 2:  nodules that are non-concerning in appearance and behavior with a very low likelihood of becoming an active cancer. Recommendation is for a repeat annual scan in 12 months.  Lung RADS 3: nodules that are probably non-concerning , includes nodules with a low likelihood of becoming an active cancer.  Recommendation is for a 6-month repeat screening scan. Often noted after an upper respiratory illness. We will be in touch to make sure you have no questions, and to schedule your 6-month scan.  Lung RADS 4 A: nodules with concerning findings, recommendation is most often for a follow up scan in 3 months or additional testing based on our provider's assessment of the scan. We will be in touch to make sure you have no questions and to schedule the recommended 3 month follow up scan.  Lung RADS 4 B:  indicates findings that are concerning. We will be in touch with you to schedule additional diagnostic testing based on our provider's  assessment of the scan.  Other options for assistance in smoking cessation (   As covered by your insurance benefits)  Hypnosis for smoking cessation  [employer] [phone]  Acupuncture for smoking cessation  [facility] [phone]   

## 2021-12-29 ENCOUNTER — Ambulatory Visit
Admission: RE | Admit: 2021-12-29 | Discharge: 2021-12-29 | Disposition: A | Discharge status: Home / Self Care | Payer: Medicare Other | Source: Ambulatory Visit | Attending: Acute Care | Admitting: Acute Care

## 2021-12-29 DIAGNOSIS — Z122 Encounter for screening for malignant neoplasm of respiratory organs: Secondary | ICD-10-CM | POA: Diagnosis present

## 2021-12-29 DIAGNOSIS — Z87891 Personal history of nicotine dependence: Secondary | ICD-10-CM | POA: Diagnosis present

## 2021-12-31 ENCOUNTER — Telehealth: Payer: Self-pay | Admitting: Acute Care

## 2021-12-31 DIAGNOSIS — R911 Solitary pulmonary nodule: Secondary | ICD-10-CM

## 2021-12-31 NOTE — Telephone Encounter (Signed)
I have attempted to call the patient with the results of their  Low Dose CT Chest Lung cancer screening scan. There was no answer at either home or mobile number. . I have left a HIPPA compliant VM requesting the patient call the office for the scan results. I included the office contact information in the message. We will await his return call. If no return call we will continue to call until patient is contacted.    Dr. Darnell Level, he needs a PET scan and follow up with you. I will be off next week, so will ask Langley Gauss or Langley Gauss to call the patient  and review the scan with him, and get the PET scan  ordered at [facility] with follow up with you after PET.   Thanks so much.

## 2021-12-31 NOTE — Telephone Encounter (Signed)
Received call report from [facility] with [facility] on patient's lung cancer screen CT done on 12/29/21. Sarah please review the result/impression copied below:  IMPRESSION: 1. Lung-RADS 4B, suspicious. Additional imaging evaluation or consultation with Pulmonology or Thoracic Surgery recommended. Dominant irregular solid 17.0 mm medial left lower lobe nodule. PET-CT recommended at this time for further characterization. 2. Three-vessel coronary atherosclerosis. 3. Diffuse moderate colonic diverticulosis. 4. Aortic Atherosclerosis (ICD10-I70.0) and Emphysema (ICD10-J43.9).  Please advise, thank you.

## 2021-12-31 NOTE — Telephone Encounter (Signed)
I was able to get in touch with the patient. I explained that his screening scan showed a  Dominant irregular solid 17.0 mm medial left lower lobe nodule. PET-CT recommended at this time for further characterization. He is in agreement with this plan.   I have ordered the PET scan. Langley Gauss and [place], please schedule for follow up with Dr. Patsey Berthold  after the PET has been done to review the results and determine treatment plan options.

## 2022-01-03 NOTE — Telephone Encounter (Signed)
Will schedule OV once PET has been scheduled.

## 2022-01-04 NOTE — Telephone Encounter (Signed)
PET is scheduled for 01/18/2022. Pt is a Dr Lanney Gins patient and is shows that pt has an appt with him on 01/25/2022.

## 2022-01-04 NOTE — Telephone Encounter (Signed)
Lm for patient to offer visit 01/25/2022.

## 2022-01-04 NOTE — Telephone Encounter (Signed)
Noted.  Will close encounter.  

## 2022-01-18 ENCOUNTER — Encounter: Admission: RE | Admit: 2022-01-18 | Payer: Medicare Other | Source: Ambulatory Visit

## 2022-02-07 ENCOUNTER — Telehealth: Payer: Self-pay | Admitting: Acute Care

## 2022-02-07 NOTE — Telephone Encounter (Signed)
Left detailed message for pt to call back to schedule PET scan follow up.

## 2022-02-24 ENCOUNTER — Ambulatory Visit: Admission: RE | Admit: 2022-02-24 | Payer: Medicare Other | Source: Ambulatory Visit

## 2022-03-03 ENCOUNTER — Ambulatory Visit
Admission: RE | Admit: 2022-03-03 | Discharge: 2022-03-03 | Disposition: A | Discharge status: Home / Self Care | Payer: Medicare Other | Source: Ambulatory Visit | Attending: Acute Care | Admitting: Acute Care

## 2022-03-03 DIAGNOSIS — C3490 Malignant neoplasm of unspecified part of unspecified bronchus or lung: Secondary | ICD-10-CM | POA: Diagnosis not present

## 2022-03-03 DIAGNOSIS — R911 Solitary pulmonary nodule: Secondary | ICD-10-CM | POA: Diagnosis present

## 2022-03-03 DIAGNOSIS — I700 Atherosclerosis of aorta: Secondary | ICD-10-CM | POA: Insufficient documentation

## 2022-03-03 DIAGNOSIS — J439 Emphysema, unspecified: Secondary | ICD-10-CM | POA: Insufficient documentation

## 2022-03-03 LAB — GLUCOSE, CAPILLARY: Glucose-Capillary: 109 mg/dL — ABNORMAL HIGH (ref 70–99)

## 2022-03-03 MED ORDER — FLUDEOXYGLUCOSE F - 18 (FDG) INJECTION
9.4000 | Freq: Once | INTRAVENOUS | Status: AC | PRN
  Administered 2022-03-03: 10.21 via INTRAVENOUS

## 2022-03-10 ENCOUNTER — Telehealth: Payer: Self-pay | Admitting: Acute Care

## 2022-03-10 NOTE — Telephone Encounter (Signed)
Patient calling back for PET scan results from 03/03/22

## 2022-03-11 ENCOUNTER — Telehealth: Payer: Self-pay | Admitting: Acute Care

## 2022-03-11 NOTE — Telephone Encounter (Signed)
Secure chat from Dr. Patsey Berthold to Dr. [facility] 03/11/2022  Hi Fred, I received a flag from the lung cancer screening program that this gentleman's PET/CT was abnormal. I just wanted to make you aware because I see by your notes that your office was waiting on this PET. PET is available for your review.  I am also including Maggie Schwalbe the lung cancer screening program navigator.   Secure chart response From Dr. Lanney Gins Thank you will follow up   Dr. Lanney Gins is to follow up with patient.

## 2022-03-11 NOTE — Telephone Encounter (Signed)
I have attempted to call the patient with the results of their  PET scan  There was no answer.The home phone got a continuous busy sign and the mobile phone went to VM. I have left a HIPPA compliant VM requesting the patient call the office for the scan results. I included the office contact information in the message. We will await his return call. If no return call we will continue to call until patient is contacted.

## 2022-03-15 NOTE — Telephone Encounter (Signed)
Pt called office again requesting PET scan results. I spoke with pt and let him know that the nodule of concern was still present on the PET scan and it does look suspicious. I advised pt that he will need to see Dr Montel Culver as soon as possible to discuss the next steps. I called Dr Teodoro Kil office and got pt's appt for PFT and Dr Alonza Smoker moved up to 04/05/22 at 2:00 for PFT and to see Dr Lanney Gins directly after. I called back pt and gave him the appt date and times and advised him that is important that he keeps these appts. Pt verbalized understanding and had no further questions.

## 2022-04-07 ENCOUNTER — Other Ambulatory Visit: Payer: Self-pay

## 2022-04-07 ENCOUNTER — Telehealth: Payer: Self-pay | Admitting: Acute Care

## 2022-04-07 DIAGNOSIS — Z87891 Personal history of nicotine dependence: Secondary | ICD-10-CM

## 2022-04-07 DIAGNOSIS — R911 Solitary pulmonary nodule: Secondary | ICD-10-CM

## 2022-04-07 NOTE — Telephone Encounter (Signed)
Order placed and patient notified of confirmation for upcoming CT.

## 2022-04-07 NOTE — Telephone Encounter (Signed)
Dr. Lanney Gins did agree with the 3 month follow up on this patient. He said he just had not finished and signed the note. He responded to the secure chat I sent last night.  Please schedule 3 month follow up low dose CT Chest. Thanks so much.

## 2022-06-03 ENCOUNTER — Ambulatory Visit: Admission: RE | Admit: 2022-06-03 | Payer: Medicare Other | Source: Ambulatory Visit

## 2022-07-13 ENCOUNTER — Other Ambulatory Visit: Payer: Self-pay | Admitting: Pulmonary Disease

## 2022-07-13 DIAGNOSIS — R911 Solitary pulmonary nodule: Secondary | ICD-10-CM

## 2022-07-21 ENCOUNTER — Ambulatory Visit: Admission: RE | Admit: 2022-07-21 | Payer: Medicare Other | Source: Ambulatory Visit

## 2023-07-10 ENCOUNTER — Other Ambulatory Visit: Payer: Self-pay | Admitting: Pulmonary Disease

## 2023-07-10 DIAGNOSIS — R911 Solitary pulmonary nodule: Secondary | ICD-10-CM
